# Patient Record
Sex: Male | Born: 1950 | Race: White | Hispanic: No | Smoking: Never smoker
Health system: Southern US, Community
[De-identification: ages and names within clinical notes are randomized; demographics above are authoritative.]

## PROBLEM LIST (undated history)

## (undated) DIAGNOSIS — I10 Essential (primary) hypertension: Secondary | ICD-10-CM

---

## 2004-05-08 HISTORY — PX: JOINT REPLACEMENT: SHX530

## 2005-06-12 ENCOUNTER — Ambulatory Visit: Payer: Self-pay | Admitting: Unknown Physician Specialty

## 2005-07-10 ENCOUNTER — Inpatient Hospital Stay (HOSPITAL_COMMUNITY): Admission: RE | Admit: 2005-07-10 | Discharge: 2005-07-12 | Payer: Self-pay | Admitting: Orthopedic Surgery

## 2009-07-09 ENCOUNTER — Ambulatory Visit: Payer: Self-pay | Admitting: Internal Medicine

## 2014-01-15 ENCOUNTER — Other Ambulatory Visit: Payer: Self-pay | Admitting: Orthopedic Surgery

## 2014-01-15 DIAGNOSIS — M25561 Pain in right knee: Secondary | ICD-10-CM

## 2014-01-24 ENCOUNTER — Ambulatory Visit
Admission: RE | Admit: 2014-01-24 | Discharge: 2014-01-24 | Disposition: A | Discharge status: Home / Self Care | Payer: BC Managed Care – PPO | Source: Ambulatory Visit | Attending: Orthopedic Surgery | Admitting: Orthopedic Surgery

## 2014-01-24 DIAGNOSIS — M25561 Pain in right knee: Secondary | ICD-10-CM

## 2014-02-25 ENCOUNTER — Ambulatory Visit: Payer: Self-pay | Admitting: General Practice

## 2014-08-29 NOTE — Op Note (Signed)
PATIENT NAME:  Joe Dalton, Joe Dalton MR#:  [id number] DATE OF BIRTH:  1951-04-24  DATE OF PROCEDURE:  02/25/2014  PREOPERATIVE DIAGNOSIS: Internal derangement of the right knee.   POSTOPERATIVE DIAGNOSES:  1.  Tear of the posterior horn of the medial meniscus, right knee.  2.  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   PROCEDURE PERFORMED: Right knee arthroscopy, partial medial meniscectomy, chondroplasty of the medial, lateral, and patellofemoral compartments.   SURGEON: Skip Estimable, MD.     ANESTHESIA: General.   ESTIMATED BLOOD LOSS: Minimal.   TOURNIQUET TIME: Not used.   FLUIDS REPLACED: 700 mL of crystalloid.   INDICATIONS FOR SURGERY: The patient is a 64 year old male who has been seen for complaints of stiffness and mild pain. MRI demonstrated findings consistent with meniscal pathology as well as some chondral thinning. After discussion of the risks and benefits of surgical intervention, the patient expressed understanding of the risks and benefits and agreed with plans for surgical intervention.   PROCEDURE IN DETAIL: The patient was brought to the operating room and after adequate general anesthesia was achieved, a tourniquet was placed and the patient's right thigh.  The leg was placed in a leg holder. All bony prominences were well padded. The patient's right knee and leg were cleaned and prepped with alcohol and DuraPrep and draped in the usual sterile fashion. A "timeout" was performed as per usual protocol. The anticipated portal sites were injected with 0.25% Marcaine with epinephrine. An anterolateral portal was created and a cannula was inserted. A mild effusion was evacuated. The scope was inserted and the knee was distended with fluid. The scope was advanced down the medial gutter and into the medial compartment of the knee. Under visualization with the scope, an anteromedial portal was created and hook probe was inserted.  Inspection of the medial compartment demonstrated a flap-type lesion involving the posterior horn of the medial meniscus. This had flipped up and over the meniscus proper and was impinging posteriorly. The fragment was pulled into the joint and then debrided using a combination of meniscal punches and a 4.5 mm shaver. Final contouring was performed using the 50 degree ArthroCare wand. The anterior horn was visualized and felt to be stable. There were changes of grade III chondromalacia involving primarily the medial femoral condyle. These areas were debrided and contoured using a combination of the 50 degree ArthroCare wand and the Paragon wand. Scope was then advanced into the intracondylar region. The anterior cruciate ligament was visualized and probed and felt to be stable. Some thickened synovial tissue was encountered in this area and this was debrided using the shaver. The scope was removed from the anterolateral portal and reinserted via the anteromedial portal so as to better visualize the lateral compartment. The articular surface was in reasonably good condition with the exception of localized defect involving the medial femoral condyle. The margins were debrided and contoured using the Paragon. The lateral meniscus was visualized and probed and felt to be stable. Finally, the scope was positioned so as to visualize the patellofemoral articulation. There were just some grade 2-3 changes involving the patellofemoral articulation. Good patellar tracking was noted. The area was debrided using the Paragon wand.   The knee was irrigated with copious amounts of fluid and then suctioned dry. The anterolateral portal was reapproximated using number 3-0 nylon. A combination of 0.25% Marcaine with epinephrine and 4 mg of morphine was injected via the scope. The scope was removed and the  anteromedial portal was reapproximated using number 3-0 nylon. Sterile dressing was applied followed by application of an ice  wrap. The patient tolerated the procedure well. He was transported to the recovery room in stable condition.     ____________________________ Laurice Record. Holley Bouche., MD jph:bu D: 02/25/2014 58:68:25 ET T: 02/26/2014 13:48:22 ET JOB#: [id number]  cc: Laurice Record. Holley Bouche., MD, <Dictator> Laurice Record Holley Bouche MD ELECTRONICALLY SIGNED 02/26/2014 19:53

## 2016-01-03 ENCOUNTER — Ambulatory Visit: Payer: Managed Care, Other (non HMO) | Admitting: Anesthesiology

## 2016-01-03 ENCOUNTER — Encounter: Payer: Self-pay | Admitting: *Deleted

## 2016-01-03 ENCOUNTER — Encounter
Admission: RE | Disposition: A | Discharge status: Home / Self Care | Payer: Self-pay | Source: Ambulatory Visit | Attending: Unknown Physician Specialty

## 2016-01-03 ENCOUNTER — Ambulatory Visit
Admission: RE | Admit: 2016-01-03 | Discharge: 2016-01-03 | Disposition: A | Discharge status: Home / Self Care | Payer: Managed Care, Other (non HMO) | Source: Ambulatory Visit | Attending: Unknown Physician Specialty | Admitting: Unknown Physician Specialty

## 2016-01-03 DIAGNOSIS — I10 Essential (primary) hypertension: Secondary | ICD-10-CM | POA: Diagnosis not present

## 2016-01-03 DIAGNOSIS — K640 First degree hemorrhoids: Secondary | ICD-10-CM | POA: Insufficient documentation

## 2016-01-03 DIAGNOSIS — D125 Benign neoplasm of sigmoid colon: Secondary | ICD-10-CM | POA: Insufficient documentation

## 2016-01-03 DIAGNOSIS — Z79899 Other long term (current) drug therapy: Secondary | ICD-10-CM | POA: Diagnosis not present

## 2016-01-03 DIAGNOSIS — Z7951 Long term (current) use of inhaled steroids: Secondary | ICD-10-CM | POA: Insufficient documentation

## 2016-01-03 DIAGNOSIS — Z1211 Encounter for screening for malignant neoplasm of colon: Secondary | ICD-10-CM | POA: Diagnosis not present

## 2016-01-03 HISTORY — DX: Essential (primary) hypertension: I10

## 2016-01-03 HISTORY — PX: COLONOSCOPY WITH PROPOFOL: SHX5780

## 2016-01-03 SURGERY — COLONOSCOPY WITH PROPOFOL
Anesthesia: General

## 2016-01-03 MED ORDER — SODIUM CHLORIDE 0.9 % IV SOLN
INTRAVENOUS | Status: DC
  Administered 2016-01-03: 1000 mL via INTRAVENOUS

## 2016-01-03 MED ORDER — PROPOFOL 500 MG/50ML IV EMUL
INTRAVENOUS | Status: DC | PRN
  Administered 2016-01-03: 125 ug/kg/min via INTRAVENOUS

## 2016-01-03 MED ORDER — SODIUM CHLORIDE 0.9 % IV SOLN: INTRAVENOUS | Status: DC

## 2016-01-03 MED ORDER — EPHEDRINE SULFATE 50 MG/ML IJ SOLN
INTRAMUSCULAR | Status: DC | PRN
  Administered 2016-01-03: 10 mg via INTRAVENOUS

## 2016-01-03 MED ORDER — PROPOFOL 10 MG/ML IV BOLUS
INTRAVENOUS | Status: DC | PRN
  Administered 2016-01-03: 120 mg via INTRAVENOUS

## 2016-01-03 MED ORDER — LIDOCAINE HCL (CARDIAC) 20 MG/ML IV SOLN
INTRAVENOUS | Status: DC | PRN
  Administered 2016-01-03: 30 mg via INTRAVENOUS

## 2016-01-03 NOTE — Anesthesia Preprocedure Evaluation (Signed)
Anesthesia Evaluation  Patient identified by MRN, date of birth, ID band Patient awake    Reviewed: Allergy & Precautions, H&P , NPO status , Patient's Chart, lab work & pertinent test results, reviewed documented beta blocker date and time   History of Anesthesia Complications Negative for: history of anesthetic complications  Airway Mallampati: I  TM Distance: >3 FB Neck ROM: full    Dental no notable dental hx. (+) Caps, Missing   Pulmonary neg pulmonary ROS,    Pulmonary exam normal breath sounds clear to auscultation       Cardiovascular Exercise Tolerance: Good hypertension, On Medications Normal cardiovascular exam Rhythm:regular Rate:Normal     Neuro/Psych negative neurological ROS  negative psych ROS   GI/Hepatic negative GI ROS, Neg liver ROS,   Endo/Other  negative endocrine ROS  Renal/GU negative Renal ROS  negative genitourinary   Musculoskeletal   Abdominal   Peds  Hematology negative hematology ROS (+)   Anesthesia Other Findings Past Medical History: No date: Hypertension   Reproductive/Obstetrics negative OB ROS                             Anesthesia Physical Anesthesia Plan  ASA: II  Anesthesia Plan: General   Post-op Pain Management:    Induction:   Airway Management Planned:   Additional Equipment:   Intra-op Plan:   Post-operative Plan:   Informed Consent: I have reviewed the patients History and Physical, chart, labs and discussed the procedure including the risks, benefits and alternatives for the proposed anesthesia with the patient or authorized representative who has indicated his/her understanding and acceptance.   Dental Advisory Given  Plan Discussed with: Anesthesiologist, CRNA and Surgeon  Anesthesia Plan Comments:         Anesthesia Quick Evaluation

## 2016-01-03 NOTE — Op Note (Signed)
[facility] Patient Name: Joe Dalton Procedure Date: 01/03/2016 7:39 AM MRN: [id number] Account #: [id number] Date of Birth: 01/23/1951 Admit Type: Outpatient Age: 65 Room: [facility] ENDO ROOM 1 Gender: Male Note Status: Finalized Procedure:            Colonoscopy Indications:          Screening for colorectal malignant neoplasm Providers:            Manya Silvas, MD Referring MD:         Leonie Douglas. Doy Hutching, MD (Referring MD) Medicines:            Propofol per Anesthesia Complications:        No immediate complications. Procedure:            Pre-Anesthesia Assessment:                       - After reviewing the risks and benefits, the patient                        was deemed in satisfactory condition to undergo the                        procedure.                       After obtaining informed consent, the colonoscope was                        passed under direct vision. Throughout the procedure,                        the patient's blood pressure, pulse, and oxygen                        saturations were monitored continuously. The                        Colonoscope was introduced through the anus and                        advanced to the the cecum, identified by appendiceal                        orifice and ileocecal valve. The colonoscopy was                        performed without difficulty. The patient tolerated the                        procedure well. The quality of the bowel preparation                        was excellent. Findings:      Two sessile polyps were found in the sigmoid colon. The polyps were       diminutive in size. These polyps were removed with a jumbo cold forceps.       Resection and retrieval were complete.      Internal hemorrhoids were found during endoscopy. The hemorrhoids were       small and Grade I (internal hemorrhoids that do not prolapse).  The exam was otherwise without abnormality. Impression:            - Two diminutive polyps in the sigmoid colon, removed                        with a jumbo cold forceps. Resected and retrieved.                       - Internal hemorrhoids.                       - The examination was otherwise normal. Recommendation:       - Await pathology results. Manya Silvas, MD 01/03/2016 8:02:48 AM This report has been signed electronically. Number of Addenda: 0 Note Initiated On: 01/03/2016 7:39 AM Scope Withdrawal Time: 0 hours 13 minutes 47 seconds  Total Procedure Duration: 0 hours 16 minutes 12 seconds       [facility]

## 2016-01-03 NOTE — Transfer of Care (Signed)
Immediate Anesthesia Transfer of Care Note  Patient: Joe Dalton  Procedure(s) Performed: Procedure(s): COLONOSCOPY WITH PROPOFOL (N/A)  Patient Location: Endoscopy Unit  Anesthesia Type:General  Level of Consciousness: sedated  Airway & Oxygen Therapy: Patient Spontanous Breathing and Patient connected to nasal cannula oxygen  Post-op Assessment: Report given to RN and Post -op Vital signs reviewed and stable  Post vital signs: Reviewed and stable  Last Vitals:  Vitals:   01/03/16 0657  BP: 132/79  Pulse: 78  Resp: 18  Temp: (!) 35.6 C    Last Pain:  Vitals:   01/03/16 0657  TempSrc: Tympanic         Complications: No apparent anesthesia complications

## 2016-01-03 NOTE — Anesthesia Postprocedure Evaluation (Signed)
Anesthesia Post Note  Patient: Joe Dalton  Procedure(s) Performed: Procedure(s) (LRB): COLONOSCOPY WITH PROPOFOL (N/A)  Patient location during evaluation: Endoscopy Anesthesia Type: General Level of consciousness: awake and alert Pain management: pain level controlled Vital Signs Assessment: post-procedure vital signs reviewed and stable Respiratory status: spontaneous breathing, nonlabored ventilation, respiratory function stable and patient connected to nasal cannula oxygen Cardiovascular status: blood pressure returned to baseline and stable Postop Assessment: no signs of nausea or vomiting Anesthetic complications: no    Last Vitals:  Vitals:   01/03/16 0820 01/03/16 0830  BP: 95/70 106/66  Pulse: 75 75  Resp: 18 18  Temp:      Last Pain:  Vitals:   01/03/16 0800  TempSrc: Tympanic                 Martha Clan

## 2016-01-03 NOTE — H&P (Signed)
   Primary Care Physician:  Idelle Crouch, MD Primary Gastroenterologist:  Dr. Vira Agar  Pre-Procedure History & Physical: HPI:  Joe Dalton is a 65 y.o. male is here for an colonoscopy.   Past Medical History:  Diagnosis Date  . Hypertension     History reviewed. No pertinent surgical history.  Prior to Admission medications   Medication Sig Start Date End Date Taking? Authorizing Provider  telmisartan-hydrochlorothiazide (MICARDIS HCT) 80-25 MG tablet Take 1 tablet by mouth daily.   Yes Historical Provider, MD  chlorpheniramine-HYDROcodone (TUSSIONEX PENNKINETIC ER) 10-8 MG/5ML SUER Take 5 mLs by mouth.    Historical Provider, MD  fluticasone (FLONASE) 50 MCG/ACT nasal spray Place into both nostrils daily.    Historical Provider, MD    Allergies as of 11/22/2015  . (Not on File)    History reviewed. No pertinent family history.  Social History   Social History  . Marital status: Divorced    Spouse name: N/A  . Number of children: N/A  . Years of education: N/A   Occupational History  . Not on file.   Social History Main Topics  . Smoking status: Not on file  . Smokeless tobacco: Not on file  . Alcohol use Not on file  . Drug use: Unknown  . Sexual activity: Not on file   Other Topics Concern  . Not on file   Social History Narrative  . No narrative on file    Review of Systems: See HPI, otherwise negative ROS  Physical Exam: BP 132/79   Pulse 78   Temp (!) 96.1 F (35.6 C) (Tympanic)   Resp 18   Ht 6' (1.829 m)   Wt 122.5 kg (270 lb)   SpO2 98%   BMI 36.62 kg/m  General:   Alert,  pleasant and cooperative in NAD Head:  Normocephalic and atraumatic. Neck:  Supple; no masses or thyromegaly. Lungs:  Clear throughout to auscultation.    Heart:  Regular rate and rhythm. Abdomen:  Soft, nontender and nondistended. Normal bowel sounds, without guarding, and without rebound.   Neurologic:  Alert and  oriented x4;  grossly normal  neurologically.  Impression/Plan: Joe Dalton is here for an colonoscopy to be performed for screening  Risks, benefits, limitations, and alternatives regarding  colonoscopy have been reviewed with the patient.  Questions have been answered.  All parties agreeable.   Gaylyn Cheers, MD  01/03/2016, 7:32 AM

## 2016-01-04 LAB — SURGICAL PATHOLOGY

## 2016-02-17 ENCOUNTER — Encounter: Payer: Self-pay | Admitting: Unknown Physician Specialty

## 2019-12-24 ENCOUNTER — Other Ambulatory Visit: Payer: Self-pay | Admitting: Surgery

## 2019-12-31 ENCOUNTER — Other Ambulatory Visit: Payer: Self-pay

## 2019-12-31 ENCOUNTER — Encounter
Admission: RE | Admit: 2019-12-31 | Discharge: 2019-12-31 | Disposition: A | Discharge status: Home / Self Care | Payer: Medicare Other | Source: Ambulatory Visit | Attending: Surgery | Admitting: Surgery

## 2019-12-31 NOTE — Patient Instructions (Signed)
Your procedure is scheduled on: Wednesday January 07, 2020. Report to Day Surgery. To find out your arrival time please call [phone] between 1PM - 3PM on Tuesday January 05, 2020.  Remember: Instructions that are not followed completely may result in serious medical risk,  up to and including death, or upon the discretion of your surgeon and anesthesiologist your  surgery may need to be rescheduled.     _X__ 1. Do not eat food after midnight the night before your procedure.                 No chewing gum or hard candies. You may drink clear liquids up to 2 hours                 before you are scheduled to arrive for your surgery- DO not drink clear                 liquids within 2 hours of the start of your surgery.                 Clear Liquids include:  water, apple juice without pulp, clear Gatorade, G2 or                  Gatorade Zero (avoid Red/Purple/Blue), Black Coffee or Tea (Do not add                 anything to coffee or tea).  __X__2.   Complete the "Ensure Clear Pre-surgery Clear Carbohydrate Drink" provided to you, 2 hours before arrival. **If you are diabetic you will be provided with an alternative drink, Gatorade Zero or G2.  __X__3.  On the morning of surgery brush your teeth with toothpaste and water, you                may rinse your mouth with mouthwash if you wish.  Do not swallow any toothpaste of mouthwash.     _X__ 4.  No Alcohol for 24 hours before or after surgery.   _X__ 5.  Do Not Smoke or use e-cigarettes For 24 Hours Prior to Your Surgery.                 Do not use any chewable tobacco products for at least 6 hours prior to                 Surgery.  _X__  6.  Do not use any recreational drugs (marijuana, cocaine, heroin, ecstasy, MDMA or other)                For at least one week prior to your surgery.  Combination of these drugs with anesthesia                May have life threatening results.  __X__  7.  Notify your  doctor if there is any change in your medical condition      (cold, fever, infections).     Do not wear jewelry, make-up, hairpins, clips or nail polish. Do not wear lotions, powders, or perfumes. You may wear deodorant. Do not shave 48 hours prior to surgery. Men may shave face and neck. Do not bring valuables to the hospital.    [facility] is not responsible for any belongings or valuables.  Contacts, dentures or bridgework may not be worn into surgery. Leave your suitcase in the car. After surgery it may be brought to your room. For patients admitted to the hospital,  discharge time is determined by your treatment team.   Patients discharged the day of surgery will not be allowed to drive home.   Make arrangements for someone to be with you for the first 24 hours of your Same Day Discharge.   __X__ Take these medicines the morning of surgery with A SIP OF WATER:    1. None   __X__ Use CHG Soap (or wipes) as directed  __X__ Stop Anti-inflammatories such as Ibuprofen, Aleve, Advil, naproxen, aspirin and or BC powders.    __X__ Stop supplements until after surgery.    __X__ Do not start any herbal supplements before your surgery.    If you have any questions regarding your pre-procedure instructions,  Please call Pre-admit Testing at [phone].

## 2020-01-05 ENCOUNTER — Other Ambulatory Visit
Admission: RE | Admit: 2020-01-05 | Discharge: 2020-01-05 | Disposition: A | Discharge status: Home / Self Care | Payer: Medicare Other | Source: Ambulatory Visit | Attending: Surgery | Admitting: Surgery

## 2020-01-05 ENCOUNTER — Encounter: Payer: Self-pay | Admitting: Urgent Care

## 2020-01-05 ENCOUNTER — Other Ambulatory Visit: Payer: Self-pay

## 2020-01-05 DIAGNOSIS — M19011 Primary osteoarthritis, right shoulder: Secondary | ICD-10-CM | POA: Insufficient documentation

## 2020-01-05 DIAGNOSIS — Z20822 Contact with and (suspected) exposure to covid-19: Secondary | ICD-10-CM | POA: Insufficient documentation

## 2020-01-05 DIAGNOSIS — Z01812 Encounter for preprocedural laboratory examination: Secondary | ICD-10-CM | POA: Insufficient documentation

## 2020-01-05 LAB — SARS CORONAVIRUS 2 (TAT 6-24 HRS): SARS Coronavirus 2: NEGATIVE

## 2020-01-07 ENCOUNTER — Ambulatory Visit: Payer: Medicare Other | Admitting: Certified Registered"

## 2020-01-07 ENCOUNTER — Encounter: Payer: Self-pay | Admitting: Surgery

## 2020-01-07 ENCOUNTER — Ambulatory Visit
Admission: RE | Admit: 2020-01-07 | Discharge: 2020-01-07 | Disposition: A | Discharge status: Home / Self Care | Payer: Medicare Other | Attending: Surgery | Admitting: Surgery

## 2020-01-07 ENCOUNTER — Encounter
Admission: RE | Disposition: A | Discharge status: Home / Self Care | Payer: Self-pay | Source: Home / Self Care | Attending: Surgery

## 2020-01-07 ENCOUNTER — Ambulatory Visit: Payer: Medicare Other

## 2020-01-07 ENCOUNTER — Other Ambulatory Visit: Payer: Self-pay

## 2020-01-07 ENCOUNTER — Ambulatory Visit: Payer: Medicare Other | Admitting: Urgent Care

## 2020-01-07 DIAGNOSIS — Z96652 Presence of left artificial knee joint: Secondary | ICD-10-CM | POA: Diagnosis not present

## 2020-01-07 DIAGNOSIS — Z79899 Other long term (current) drug therapy: Secondary | ICD-10-CM | POA: Insufficient documentation

## 2020-01-07 DIAGNOSIS — N289 Disorder of kidney and ureter, unspecified: Secondary | ICD-10-CM | POA: Insufficient documentation

## 2020-01-07 DIAGNOSIS — I10 Essential (primary) hypertension: Secondary | ICD-10-CM | POA: Insufficient documentation

## 2020-01-07 DIAGNOSIS — M19011 Primary osteoarthritis, right shoulder: Secondary | ICD-10-CM | POA: Diagnosis not present

## 2020-01-07 DIAGNOSIS — Z419 Encounter for procedure for purposes other than remedying health state, unspecified: Secondary | ICD-10-CM

## 2020-01-07 HISTORY — PX: RESECTION DISTAL CLAVICAL: SHX5053

## 2020-01-07 SURGERY — EXCISION, CLAVICLE, DISTAL, OPEN
Anesthesia: General | Laterality: Right

## 2020-01-07 MED ORDER — BUPIVACAINE-EPINEPHRINE (PF) 0.25% -1:200000 IJ SOLN
INTRAMUSCULAR | Status: AC
  Filled 2020-01-07: qty 30

## 2020-01-07 MED ORDER — POTASSIUM CHLORIDE IN NACL 20-0.9 MEQ/L-% IV SOLN
INTRAVENOUS | Status: DC
  Filled 2020-01-07 (×3): qty 1000

## 2020-01-07 MED ORDER — ONDANSETRON HCL 4 MG PO TABS: 4.0000 mg | ORAL_TABLET | Freq: Four times a day (QID) | ORAL | Status: DC | PRN

## 2020-01-07 MED ORDER — ONDANSETRON HCL 4 MG/2ML IJ SOLN
4.0000 mg | Freq: Four times a day (QID) | INTRAMUSCULAR | Status: DC | PRN
  Administered 2020-01-07: 4 mg via INTRAVENOUS

## 2020-01-07 MED ORDER — ROCURONIUM BROMIDE 10 MG/ML (PF) SYRINGE
PREFILLED_SYRINGE | INTRAVENOUS | Status: AC
  Filled 2020-01-07: qty 10

## 2020-01-07 MED ORDER — FENTANYL CITRATE (PF) 100 MCG/2ML IJ SOLN
INTRAMUSCULAR | Status: AC
  Filled 2020-01-07: qty 2

## 2020-01-07 MED ORDER — LACTATED RINGERS IV SOLN: INTRAVENOUS | Status: DC

## 2020-01-07 MED ORDER — LIDOCAINE HCL (CARDIAC) PF 100 MG/5ML IV SOSY
PREFILLED_SYRINGE | INTRAVENOUS | Status: DC | PRN
  Administered 2020-01-07: 100 mg via INTRAVENOUS

## 2020-01-07 MED ORDER — METOCLOPRAMIDE HCL 10 MG PO TABS: 5.0000 mg | ORAL_TABLET | Freq: Three times a day (TID) | ORAL | Status: DC | PRN

## 2020-01-07 MED ORDER — BUPIVACAINE LIPOSOME 1.3 % IJ SUSP
INTRAMUSCULAR | Status: AC
  Filled 2020-01-07: qty 20

## 2020-01-07 MED ORDER — ORAL CARE MOUTH RINSE: 15.0000 mL | Freq: Once | OROMUCOSAL | Status: AC

## 2020-01-07 MED ORDER — CHLORHEXIDINE GLUCONATE 0.12 % MT SOLN: 15.0000 mL | Freq: Once | OROMUCOSAL | Status: DC

## 2020-01-07 MED ORDER — ACETAMINOPHEN 10 MG/ML IV SOLN
INTRAVENOUS | Status: AC
  Filled 2020-01-07: qty 100

## 2020-01-07 MED ORDER — FAMOTIDINE 20 MG PO TABS
ORAL_TABLET | ORAL | Status: AC
  Administered 2020-01-07: 20 mg via ORAL
  Filled 2020-01-07: qty 1

## 2020-01-07 MED ORDER — FENTANYL CITRATE (PF) 100 MCG/2ML IJ SOLN
INTRAMUSCULAR | Status: AC
Start: 2020-01-07 — End: ?
  Filled 2020-01-07: qty 2

## 2020-01-07 MED ORDER — ACETAMINOPHEN 10 MG/ML IV SOLN
INTRAVENOUS | Status: DC | PRN
  Administered 2020-01-07: 1000 mg via INTRAVENOUS

## 2020-01-07 MED ORDER — DEXAMETHASONE SODIUM PHOSPHATE 10 MG/ML IJ SOLN
INTRAMUSCULAR | Status: DC | PRN
  Administered 2020-01-07: 5 mg via INTRAVENOUS

## 2020-01-07 MED ORDER — FAMOTIDINE 20 MG PO TABS: 20.0000 mg | ORAL_TABLET | Freq: Once | ORAL | Status: AC

## 2020-01-07 MED ORDER — DEXAMETHASONE SODIUM PHOSPHATE 10 MG/ML IJ SOLN
INTRAMUSCULAR | Status: AC
  Filled 2020-01-07: qty 1

## 2020-01-07 MED ORDER — DEXMEDETOMIDINE HCL 200 MCG/2ML IV SOLN
INTRAVENOUS | Status: DC | PRN
  Administered 2020-01-07: 12 ug via INTRAVENOUS

## 2020-01-07 MED ORDER — PROPOFOL 10 MG/ML IV BOLUS
INTRAVENOUS | Status: DC | PRN
  Administered 2020-01-07: 180 mg via INTRAVENOUS

## 2020-01-07 MED ORDER — METOCLOPRAMIDE HCL 5 MG/ML IJ SOLN: 5.0000 mg | Freq: Three times a day (TID) | INTRAMUSCULAR | Status: DC | PRN

## 2020-01-07 MED ORDER — HYDROCODONE-ACETAMINOPHEN 5-325 MG PO TABS
1.0000 | ORAL_TABLET | Freq: Four times a day (QID) | ORAL | 0 refills | Status: AC | PRN
Start: 2020-01-07 — End: ?

## 2020-01-07 MED ORDER — ONDANSETRON HCL 4 MG/2ML IJ SOLN
INTRAMUSCULAR | Status: AC
  Filled 2020-01-07: qty 2

## 2020-01-07 MED ORDER — PROMETHAZINE HCL 25 MG/ML IJ SOLN: 6.2500 mg | INTRAMUSCULAR | Status: DC | PRN

## 2020-01-07 MED ORDER — FENTANYL CITRATE (PF) 100 MCG/2ML IJ SOLN
INTRAMUSCULAR | Status: DC | PRN
Start: 2020-01-07 — End: 2020-01-07
  Administered 2020-01-07: 100 ug via INTRAVENOUS
  Administered 2020-01-07 (×2): 50 ug via INTRAVENOUS

## 2020-01-07 MED ORDER — BUPIVACAINE LIPOSOME 1.3 % IJ SUSP
INTRAMUSCULAR | Status: DC | PRN
  Administered 2020-01-07: 20 mL

## 2020-01-07 MED ORDER — PROPOFOL 10 MG/ML IV BOLUS
INTRAVENOUS | Status: AC
  Filled 2020-01-07: qty 40

## 2020-01-07 MED ORDER — LIDOCAINE HCL (PF) 2 % IJ SOLN
INTRAMUSCULAR | Status: AC
  Filled 2020-01-07: qty 5

## 2020-01-07 MED ORDER — PHENYLEPHRINE HCL-NACL 20-0.9 MG/250ML-% IV SOLN
INTRAVENOUS | Status: DC | PRN
  Administered 2020-01-07: 50 ug/min via INTRAVENOUS

## 2020-01-07 MED ORDER — EPHEDRINE SULFATE 50 MG/ML IJ SOLN
INTRAMUSCULAR | Status: DC | PRN
  Administered 2020-01-07: 10 mg via INTRAVENOUS

## 2020-01-07 MED ORDER — CHLORHEXIDINE GLUCONATE 0.12 % MT SOLN: 15.0000 mL | Freq: Once | OROMUCOSAL | Status: AC

## 2020-01-07 MED ORDER — DEXTROSE 5 % IV SOLN
3.0000 g | INTRAVENOUS | Status: AC
  Administered 2020-01-07: 3 g via INTRAVENOUS
  Filled 2020-01-07: qty 3

## 2020-01-07 MED ORDER — OXYCODONE HCL 5 MG/5ML PO SOLN: 5.0000 mg | Freq: Once | ORAL | Status: DC | PRN

## 2020-01-07 MED ORDER — OXYCODONE HCL 5 MG PO TABS: 5.0000 mg | ORAL_TABLET | Freq: Once | ORAL | Status: DC | PRN

## 2020-01-07 MED ORDER — MEPERIDINE HCL 50 MG/ML IJ SOLN: 6.2500 mg | INTRAMUSCULAR | Status: DC | PRN

## 2020-01-07 MED ORDER — BUPIVACAINE-EPINEPHRINE (PF) 0.5% -1:200000 IJ SOLN
INTRAMUSCULAR | Status: AC
  Filled 2020-01-07: qty 30

## 2020-01-07 MED ORDER — FENTANYL CITRATE (PF) 100 MCG/2ML IJ SOLN: 25.0000 ug | INTRAMUSCULAR | Status: DC | PRN

## 2020-01-07 MED ORDER — ONDANSETRON HCL 4 MG/2ML IJ SOLN
INTRAMUSCULAR | Status: DC | PRN
  Administered 2020-01-07: 4 mg via INTRAVENOUS

## 2020-01-07 MED ORDER — ROCURONIUM BROMIDE 100 MG/10ML IV SOLN
INTRAVENOUS | Status: DC | PRN
  Administered 2020-01-07: 60 mg via INTRAVENOUS

## 2020-01-07 MED ORDER — HYDROCODONE-ACETAMINOPHEN 5-325 MG PO TABS: 1.0000 | ORAL_TABLET | ORAL | Status: DC | PRN

## 2020-01-07 MED ORDER — ORAL CARE MOUTH RINSE: 15.0000 mL | Freq: Once | OROMUCOSAL | Status: DC

## 2020-01-07 MED ORDER — PHENYLEPHRINE HCL (PRESSORS) 10 MG/ML IV SOLN
INTRAVENOUS | Status: DC | PRN
  Administered 2020-01-07 (×3): 100 ug via INTRAVENOUS

## 2020-01-07 MED ORDER — EPHEDRINE 5 MG/ML INJ
INTRAVENOUS | Status: AC
  Filled 2020-01-07: qty 10

## 2020-01-07 MED ORDER — CHLORHEXIDINE GLUCONATE 0.12 % MT SOLN
OROMUCOSAL | Status: AC
  Administered 2020-01-07: 15 mL via OROMUCOSAL
  Filled 2020-01-07: qty 15

## 2020-01-07 MED ORDER — SUGAMMADEX SODIUM 200 MG/2ML IV SOLN
INTRAVENOUS | Status: DC | PRN
  Administered 2020-01-07: 200 mg via INTRAVENOUS

## 2020-01-07 MED ORDER — PHENYLEPHRINE HCL (PRESSORS) 10 MG/ML IV SOLN
INTRAVENOUS | Status: AC
  Filled 2020-01-07: qty 1

## 2020-01-07 SURGICAL SUPPLY — 45 items
APL PRP STRL LF DISP 70% ISPRP (MISCELLANEOUS) ×2
BLADE MED AGGRESSIVE (BLADE) ×2 IMPLANT
BNDG COHESIVE 4X5 TAN STRL (GAUZE/BANDAGES/DRESSINGS) ×3 IMPLANT
CANISTER SUCT 1200ML W/VALVE (MISCELLANEOUS) ×3 IMPLANT
CHLORAPREP W/TINT 26 (MISCELLANEOUS) ×6 IMPLANT
CLOSURE WOUND 1/2 X4 (GAUZE/BANDAGES/DRESSINGS) ×1
CLOSURE WOUND 1/4X4 (GAUZE/BANDAGES/DRESSINGS) ×1
COVER WAND RF STERILE (DRAPES) ×3 IMPLANT
DRAPE C-ARM XRAY 36X54 (DRAPES) ×1 IMPLANT
DRAPE INCISE IOBAN 66X45 STRL (DRAPES) ×6 IMPLANT
DRAPE SPLIT 6X30 W/TAPE (DRAPES) ×6 IMPLANT
DRSG OPSITE POSTOP 4X6 (GAUZE/BANDAGES/DRESSINGS) ×3 IMPLANT
GAUZE SPONGE 4X4 12PLY STRL (GAUZE/BANDAGES/DRESSINGS) ×3 IMPLANT
GAUZE XEROFORM 1X8 LF (GAUZE/BANDAGES/DRESSINGS) ×2 IMPLANT
GLOVE BIO SURGEON STRL SZ7.5 (GLOVE) ×6 IMPLANT
GLOVE BIO SURGEON STRL SZ8 (GLOVE) ×6 IMPLANT
GLOVE BIOGEL PI IND STRL 8 (GLOVE) ×2 IMPLANT
GLOVE BIOGEL PI INDICATOR 8 (GLOVE) ×4
GLOVE INDICATOR 8.0 STRL GRN (GLOVE) ×3 IMPLANT
GLOVE SURG XRAY 8.0 LX (GLOVE) ×1 IMPLANT
GOWN STRL REUS W/ TWL LRG LVL3 (GOWN DISPOSABLE) ×1 IMPLANT
GOWN STRL REUS W/ TWL XL LVL3 (GOWN DISPOSABLE) ×1 IMPLANT
GOWN STRL REUS W/TWL LRG LVL3 (GOWN DISPOSABLE) ×3
GOWN STRL REUS W/TWL XL LVL3 (GOWN DISPOSABLE) ×3
IMMOBILIZER SHDR LG LX 900803 (SOFTGOODS) ×3 IMPLANT
KIT STABILIZATION SHOULDER (MISCELLANEOUS) ×1 IMPLANT
KIT TURNOVER KIT A (KITS) ×3 IMPLANT
MASK FACE SPIDER DISP (MASK) ×3 IMPLANT
MAT ABSORB  FLUID 56X50 GRAY (MISCELLANEOUS) ×3
MAT ABSORB FLUID 56X50 GRAY (MISCELLANEOUS) ×1 IMPLANT
NDL FILTER BLUNT 18X1 1/2 (NEEDLE) ×1 IMPLANT
NEEDLE FILTER BLUNT 18X 1/2SAF (NEEDLE) ×2
NEEDLE FILTER BLUNT 18X1 1/2 (NEEDLE) ×1 IMPLANT
NS IRRIG 500ML POUR BTL (IV SOLUTION) ×3 IMPLANT
PACK SHDR ARTHRO (MISCELLANEOUS) ×3 IMPLANT
SLING ARM LRG DEEP (SOFTGOODS) ×2 IMPLANT
STAPLER SKIN PROX 35W (STAPLE) ×3 IMPLANT
STRIP CLOSURE SKIN 1/2X4 (GAUZE/BANDAGES/DRESSINGS) ×2 IMPLANT
STRIP CLOSURE SKIN 1/4X4 (GAUZE/BANDAGES/DRESSINGS) ×1 IMPLANT
SUT VIC AB 0 CT1 36 (SUTURE) ×2 IMPLANT
SUT VIC AB 2-0 CT1 27 (SUTURE) ×3
SUT VIC AB 2-0 CT1 TAPERPNT 27 (SUTURE) ×2 IMPLANT
SUT VIC AB 2-0 CT2 27 (SUTURE) ×4 IMPLANT
SYR 10ML LL (SYRINGE) ×3 IMPLANT
SYR BULB IRRIG 60ML STRL (SYRINGE) ×2 IMPLANT

## 2020-01-07 NOTE — Anesthesia Postprocedure Evaluation (Signed)
Anesthesia Post Note  Patient: Joe Dalton  Procedure(s) Performed: DISTAL CLAVICAL EXCISON OPEN (Right )  Patient location during evaluation: PACU Anesthesia Type: General Level of consciousness: awake and alert Pain management: pain level controlled Vital Signs Assessment: post-procedure vital signs reviewed and stable Respiratory status: spontaneous breathing and respiratory function stable Cardiovascular status: stable Anesthetic complications: no   No complications documented.   Last Vitals:  Vitals:   01/07/20 1015 01/07/20 1022  BP: 135/68 137/69  Pulse: 70 75  Resp: 18 18  Temp: (!) 36.1 C (!) 36.3 C  SpO2: 95% 95%    Last Pain:  Vitals:   01/07/20 1022  TempSrc: Temporal  PainSc: 0-No pain                 Karlissa Aron K

## 2020-01-07 NOTE — Discharge Instructions (Addendum)
AMBULATORY SURGERY  DISCHARGE INSTRUCTIONS   1) The drugs that you were given will stay in your system until tomorrow so for the next 24 hours you should not:  A) Drive an automobile B) Make any legal decisions C) Drink any alcoholic beverage   2) You may resume regular meals tomorrow.  Today it is better to start with liquids and gradually work up to solid foods.  You may eat anything you prefer, but it is better to start with liquids, then soup and crackers, and gradually work up to solid foods.   3) Please notify your doctor immediately if you have any unusual bleeding, trouble breathing, redness and pain at the surgery site, drainage, fever, or pain not relieved by medication.    4) Additional Instructions:        Please contact your physician with any problems or Same Day Surgery at [phone], Monday through Friday 6 am to 4 pm, or  at [facility] number at [phone].Orthopedic discharge instructions: May shower with intact op-site dressing. Apply ice frequently to shoulder. Take Aleve 2 tabs BID with meals for 7-10 days, then as necessary. Take hydrocodone as prescribed when needed.  May supplement with ES Tylenol if necessary. Use sling as necessary for comfort. Follow-up in 10-14 days or as scheduled.

## 2020-01-07 NOTE — Anesthesia Preprocedure Evaluation (Addendum)
Anesthesia Evaluation  Patient identified by MRN, date of birth, ID band Patient awake    Reviewed: Allergy & Precautions, NPO status , Patient's Chart, lab work & pertinent test results  History of Anesthesia Complications Negative for: history of anesthetic complications  Airway Mallampati: II  TM Distance: >3 FB Neck ROM: Full    Dental  (+) Implants   Pulmonary neg pulmonary ROS, neg sleep apnea, neg COPD,    breath sounds clear to auscultation- rhonchi (-) wheezing      Cardiovascular Exercise Tolerance: Good hypertension, Pt. on medications (-) CAD, (-) Past MI, (-) Cardiac Stents and (-) CABG  Rhythm:Regular Rate:Normal - Systolic murmurs and - Diastolic murmurs    Neuro/Psych neg Seizures negative neurological ROS  negative psych ROS   GI/Hepatic negative GI ROS, Neg liver ROS,   Endo/Other  negative endocrine ROSneg diabetes  Renal/GU negative Renal ROS     Musculoskeletal negative musculoskeletal ROS (+)   Abdominal (+) + obese,   Peds  Hematology negative hematology ROS (+)   Anesthesia Other Findings Past Medical History: No date: Hypertension   Reproductive/Obstetrics                             Anesthesia Physical Anesthesia Plan  ASA: II  Anesthesia Plan: General   Post-op Pain Management:    Induction: Intravenous  PONV Risk Score and Plan: 1 and Ondansetron and Dexamethasone  Airway Management Planned: Oral ETT  Additional Equipment:   Intra-op Plan:   Post-operative Plan: Extubation in OR  Informed Consent: I have reviewed the patients History and Physical, chart, labs and discussed the procedure including the risks, benefits and alternatives for the proposed anesthesia with the patient or authorized representative who has indicated his/her understanding and acceptance.     Dental advisory given  Plan Discussed with: CRNA and  Anesthesiologist  Anesthesia Plan Comments:         Anesthesia Quick Evaluation

## 2020-01-07 NOTE — Anesthesia Procedure Notes (Signed)
Procedure Name: Intubation Date/Time: 01/07/2020 8:25 AM Performed by: Chanetta Marshall, CRNA Pre-anesthesia Checklist: Patient identified, Emergency Drugs available, Suction available and Patient being monitored Patient Re-evaluated:Patient Re-evaluated prior to induction Oxygen Delivery Method: Circle system utilized Preoxygenation: Pre-oxygenation with 100% oxygen Induction Type: IV induction Ventilation: Mask ventilation with difficulty and Two handed mask ventilation required Laryngoscope Size: McGraph and 3 Grade View: Grade I Tube type: Oral Tube size: 7.5 mm Number of attempts: 1 Airway Equipment and Method: Video-laryngoscopy Placement Confirmation: ETT inserted through vocal cords under direct vision,  positive ETCO2,  breath sounds checked- equal and bilateral and CO2 detector Secured at: 23 cm Tube secured with: Tape Dental Injury: Teeth and Oropharynx as per pre-operative assessment

## 2020-01-07 NOTE — Transfer of Care (Signed)
Immediate Anesthesia Transfer of Care Note  Patient: Joe Dalton  Procedure(s) Performed: DISTAL CLAVICAL EXCISON OPEN (Right )  Patient Location: PACU  Anesthesia Type:General  Level of Consciousness: awake, alert  and oriented  Airway & Oxygen Therapy: Patient Spontanous Breathing and Patient connected to face mask oxygen  Post-op Assessment: Report given to RN and Post -op Vital signs reviewed and stable  Post vital signs: Reviewed and stable  Last Vitals:  Vitals Value Taken Time  BP    Temp    Pulse    Resp    SpO2      Last Pain:  Vitals:   01/07/20 0709  TempSrc: Temporal  PainSc: 0-No pain      Patients Stated Pain Goal: 0 (57/47/34 0370)  Complications: No complications documented.

## 2020-01-07 NOTE — H&P (Signed)
History of Present Illness:  Joe Dalton is a 69 y.o. male who presents today as a result of a call to our clinic for right shoulder pain.   The patient's symptoms began about 6 months ago and developed without any specific cause or injury. 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 Also, the patient played football through college. He saw Dr. Candelaria Stagers 3 months ago who obtained x-rays of his shoulder, diagnosed him with degenerative joint disease, and gave him an intra-articular steroid injection under ultrasound guidance which he states provided little if any relief of his symptoms. The patient describes the symptoms as moderate (patient is active but has had to make modifications or give up activities) and have the quality of being aching, nagging, stabbing and throbbing. The pain is localized to the acromioclavicular joint. These symptoms are aggravated with normal daily activities, with sleeping, at higher levels of activity, with overhead activity and exercising. He has tried no medications for this problem. He has tried rest with no significant benefit. He has tried the 1 injection described above, but otherwise has not tried any other treatments for these symptoms.  This complaint is not work related. He is a sports non-participant.  Shoulder Surgical History:  The patient has had no shoulder surgery in the past.  PMH/PSH/Family History/Social History/Meds/Allergies:  I have reviewed past medical, surgical, social and family history, medications and allergies as documented in the EMR.  Current Outpatient Medications: . telmisartan-hydrochlorothiazide (MICARDIS HCT) 80-25 mg tablet Take 1 tablet by mouth once daily 90 tablet 3   No current Epic-ordered facility-administered medications on file.   Allergies: No Known Allergies  Past Medical History:  . Anemia  remote hx of  . Gastric ulcer  . Hx of viral meningitis  . Hypertension  . Renal  insufficiency  . Venous stasis  with peripheral edema   Past Surgical History:  . COLONOSCOPY 06/12/2005  Int Hemorrhoids: CBF 06/2015; Recall Ltr mailed 04/27/2015 (dw); 2nd Recall Ltr mailed 10/18/2015 (dw)  . COLONOSCOPY 01/03/2016  Adenomatous Polyp: CBF 12/2020  . EGD 12/13/2002  . KNEE ARTHROSCOPY  Left knee arthroscopy  . LEFT TOTAL KNEE REPLACEMENT  . Right knee arthroscopy, partial medial meniscectomy, chondroplasty of the medial, lateral, and patellofemoral compartments. Right 02/25/14  . TONSILLECTOMY   Family History:  . Diabetes type II Mother  . High blood pressure (Hypertension) Mother  . Coronary Artery Disease (Blocked arteries around heart) Father  . High blood pressure (Hypertension) Father   Social History:  Socioeconomic History  . Marital status: Divorced  Spouse name: Silva Bandy  . Number of children: 0  . Years of education: 26  . Highest education level: Not on file  Occupational History  . Occupation: Full time PA  Tobacco Use  . Smoking status: Never Smoker  . Smokeless tobacco: Never Used  Vaping Use  . Vaping Use: Never used  Substance and Sexual Activity  . Alcohol use: No  Alcohol/week: 0.0 standard drinks  . Drug use: Not on file  . Sexual activity: Not on file  Other Topics Concern  . Not on file  Social History Narrative  . Not on file   Social Determinants of Health   Financial Resource Strain:  . Difficulty of Paying Living Expenses:  Food Insecurity:  . Worried About Charity fundraiser in the Last Year:  . [place] in the Last Year:  Transportation Needs:  . Lack of  Transportation (Medical):  Marland Kitchen Lack of Transportation (Non-Medical):   Review of Systems:  A comprehensive 14 point ROS was performed, reviewed, and the pertinent orthopaedic findings are documented in the HPI.  Physical Exam:  Vitals:  12/19/19 1310  Weight: (!) 112 kg (247 lb)  Height: 180.3 cm (5\' 11" )   General/Constitutional: Pleasant husky  middle-aged male in no acute distress. Neuro/Psych: Normal mood and affect, oriented to person, place and time. Eyes: Non-icteric. Pupils are equal, round, and reactive to light, and exhibit synchronous movement. ENT: Unremarkable. Lymphatic: No palpable adenopathy. Respiratory: Lungs clear to auscultation, Normal chest excursion, No wheezes and Non-labored breathing Cardiovascular: Regular rate and rhythm. No murmurs. and No edema, swelling or tenderness, except as noted in detailed exam. Integumentary: No impressive skin lesions present, except as noted in detailed exam. Musculoskeletal: Unremarkable, except as noted in detailed exam.  Right shoulder exam: SKIN: normal SWELLING: none WARMTH: none LYMPH NODES: no adenopathy palpable CREPITUS: none TENDERNESS: Moderate focal tenderness palpation over AC joint ROM (active):  Forward flexion: 130 degrees Abduction: 125 degrees Internal rotation: Right PSIS ROM (passive):  Forward flexion: 135 degrees Abduction: 130 degrees  ER/IR at 90 abd: 70 degrees / 40 degrees  He describes mild discomfort with internal rotation and internal rotation at 90 degrees of abduction.  STRENGTH: Forward flexion: 5/5 Abduction: 5/5 External rotation: 5/5 Internal rotation: 5/5 Pain with RC testing: No  STABILITY: Normal  SPECIAL TESTS: Luan Pulling' test: positive, mild Speed's test: negative Capsulitis - pain w/ passive ER: no Crossed arm test: Mildly positive Crank: Not evaluated Anterior apprehension: Negative Posterior apprehension: Not evaluated  He is neurovascularly intact to the right upper extremity.  Imaging:  Shoulder X-Ray Imaging: Recent true AP and Y-scapular views of the right shoulder are available for review. These films demonstrate significant degenerative changes with complete loss of the glenohumeral joint space. Lesser degenerative changes of the [facility] joint also are noted. The subacromial space is well-maintained. There is no  subacromial or infra-clavicular spurring. He demonstrates a Type II acromion.  Assessment:  . DJD of right AC (acromioclavicular) joint.  . Primary osteoarthritis of right shoulder.   Plan:  The treatment options were discussed with the patient. In addition, patient educational materials were provided regarding the diagnosis and treatment options. Despite the fairly advanced degenerative changes of his glenohumeral joint noted on plain radiographs, his exam suggest that his symptoms are actually coming from his [facility] joint rather than from the glenohumeral joint. The patient is quite frustrated by his symptoms and functional limitations, and is ready to consider more aggressive treatment options. Therefore, I have recommended a surgical procedure, specifically an open excision of the right distal clavicle. The procedure was discussed with the patient, as were the potential risks (including bleeding, infection, nerve and/or blood vessel injury, persistent or recurrent pain, need for further surgery, blood clots, strokes, heart attacks and/or arhythmias, pneumonia, etc.) and benefits. The patient states his understanding and wishes to proceed. All of the patient's questions and concerns were answered. He can call any time with further concerns. He will follow up post-surgery, routine.   H&P reviewed and patient re-examined. No changes.

## 2020-01-07 NOTE — Op Note (Signed)
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  Post-Op Diagnosis:   Same.  Procedure:   Open right distal clavicle excision.  Surgeon:   Pascal Lux, MD  Assistant:   None  Anesthesia:   GET  Findings:   As above.  Complications:   None  EBL:   25 cc  Fluids:   500 cc crystalloid  TT:   None  Drains:   None  Closure:   2-0 Vicryl subcuticular sutures.  Brief Clinical Note:   The patient is a 69 year old male with a history of progressively worsening superior right shoulder pain. His symptoms have progressed despite medications, activity modification, etc. His history examination consistent with advanced degenerative joint disease of the right AC joint, confirmed by plain radiographs. The patient presents at this time for definitive management of this injury.  Procedure:   The patient was brought into the operating room and lain in the supine position. After adequate general endotracheal intubation and anesthesia were obtained, the patient was repositioned in the beach chair position using the beach chair positioner. The right shoulder and upper extremity were prepped with DuraPrep solution before being draped sterilely. Preoperative antibiotics were administered. A timeout was performed to verify the appropriate surgical site.   An approximately 6 to 7 cm saber-type incision was made obliquely centered over the prominent distal clavicle. The incision was carried down through the subcutaneous tissues to expose the trapezio-pectoral fascia. This was split in line with the distal clavicle and the soft tissues elevated subperiosteally to expose the distal clavicle circumferentially. The distal 8 to 10 mm of distal clavicle was removed using a micro-oscillating saw before a small amount of bone wax was applied over the exposed end of the distal clavicle to help with hemostasis as well as to try to minimize the  risk of clavicular overgrowth. Hemostasis was achieved using electrocautery.  The wound was copiously irrigated with sterile saline solution before the capsular tissue over the [facility] joint was reapproximated using #0 Vicryl interrupted sutures. The subcutaneous tissues were closed using 2-0 Vicryl interrupted sutures before the skin was closed using 2-0 Vicryl inverted subcuticular sutures. Benzoin and Steri-Strips are applied to the skin. A total of 20 cc of Exparel was injected in and around the incision to help with postoperative analgesia before a sterile occlusive dressing was applied to the wound. The patient was placed into a shoulder sling before being awakened, extubated, and returned to the recovery room in satisfactory condition after tolerating the procedure well.

## 2020-01-08 LAB — SURGICAL PATHOLOGY

## 2021-04-08 ENCOUNTER — Other Ambulatory Visit: Payer: Self-pay | Admitting: Internal Medicine

## 2021-04-08 ENCOUNTER — Ambulatory Visit: Payer: Medicare Other

## 2021-04-08 DIAGNOSIS — M7989 Other specified soft tissue disorders: Secondary | ICD-10-CM

## 2021-04-11 ENCOUNTER — Other Ambulatory Visit: Payer: Self-pay

## 2021-04-11 ENCOUNTER — Ambulatory Visit
Admission: RE | Admit: 2021-04-11 | Discharge: 2021-04-11 | Disposition: A | Discharge status: Home / Self Care | Payer: Medicare Other | Source: Ambulatory Visit | Attending: Internal Medicine | Admitting: Internal Medicine

## 2021-04-11 DIAGNOSIS — M7989 Other specified soft tissue disorders: Secondary | ICD-10-CM | POA: Diagnosis present

## 2021-04-13 ENCOUNTER — Telehealth (INDEPENDENT_AMBULATORY_CARE_PROVIDER_SITE_OTHER): Payer: Self-pay

## 2021-04-13 NOTE — Telephone Encounter (Signed)
Patient called in stating that provider was speaking with Dr.Hotten about this patient and this patient called in wanting to speak with Dr. Carroll Kinds and get set up ASAP and would like Dr.Schiner to give him a call.  Patient states Dr.Schiner is familiar with him and know whats going on    Please call and advise

## 2021-04-14 ENCOUNTER — Telehealth (INDEPENDENT_AMBULATORY_CARE_PROVIDER_SITE_OTHER): Payer: Self-pay

## 2021-04-14 NOTE — Telephone Encounter (Signed)
Spoke with the patient and he is scheduled with Dr. Lucky Cowboy for a RLE thombectomy on 04/18/21 with a 2:00 pm arrival time to the MM. Pre-procedure instructions were discussed and patient did have a couple of questions that were answered.

## 2021-04-14 NOTE — Telephone Encounter (Signed)
It was Dr. Delana Meyer that I messeged.

## 2021-04-14 NOTE — Telephone Encounter (Signed)
I messaged the provider yesterday per a secure chat about this pt an what he needed .

## 2021-04-15 NOTE — Telephone Encounter (Signed)
Patient was made aware that his new arrival time for his procedure on 04/18/21 will be 9 am

## 2021-04-18 ENCOUNTER — Encounter: Admission: RE | Payer: Self-pay | Source: Home / Self Care

## 2021-04-18 ENCOUNTER — Ambulatory Visit: Admit: 2021-04-18 | Payer: Medicare Other | Admitting: Vascular Surgery

## 2021-04-18 ENCOUNTER — Other Ambulatory Visit (INDEPENDENT_AMBULATORY_CARE_PROVIDER_SITE_OTHER): Payer: Self-pay | Admitting: Nurse Practitioner

## 2021-04-18 ENCOUNTER — Ambulatory Visit: Admission: RE | Admit: 2021-04-18 | Payer: Medicare Other | Source: Home / Self Care | Admitting: Vascular Surgery

## 2021-04-18 DIAGNOSIS — I82409 Acute embolism and thrombosis of unspecified deep veins of unspecified lower extremity: Secondary | ICD-10-CM

## 2021-04-18 DIAGNOSIS — I82411 Acute embolism and thrombosis of right femoral vein: Secondary | ICD-10-CM

## 2021-04-18 SURGERY — PERIPHERAL VASCULAR THROMBECTOMY
Anesthesia: Moderate Sedation | Laterality: Right

## 2021-09-20 ENCOUNTER — Other Ambulatory Visit (INDEPENDENT_AMBULATORY_CARE_PROVIDER_SITE_OTHER): Payer: Self-pay | Admitting: Vascular Surgery

## 2021-09-20 DIAGNOSIS — I82401 Acute embolism and thrombosis of unspecified deep veins of right lower extremity: Secondary | ICD-10-CM

## 2021-09-22 ENCOUNTER — Ambulatory Visit (INDEPENDENT_AMBULATORY_CARE_PROVIDER_SITE_OTHER): Payer: Medicare Other

## 2021-09-22 ENCOUNTER — Ambulatory Visit (INDEPENDENT_AMBULATORY_CARE_PROVIDER_SITE_OTHER): Payer: Self-pay | Admitting: Vascular Surgery

## 2021-09-22 DIAGNOSIS — I82401 Acute embolism and thrombosis of unspecified deep veins of right lower extremity: Secondary | ICD-10-CM | POA: Diagnosis not present

## 2023-05-02 IMAGING — US US EXTREM LOW VENOUS*R*
1 series · 14 of 24 positions shown · non-contrast
Comparison: None.

CLINICAL DATA: Leg swelling

EXAM:
RIGHT LOWER EXTREMITY VENOUS DOPPLER ULTRASOUND
TECHNIQUE: Gray-scale sonography with compression, as well as color and duplex
ultrasound, were performed to evaluate the deep venous system(s)
from the level of the common femoral vein through the popliteal and
proximal calf veins.

[Series 1: us venous img lower uni right (dvt) · portal-venous · 14 of 34 slices shown]
[im 1/34]
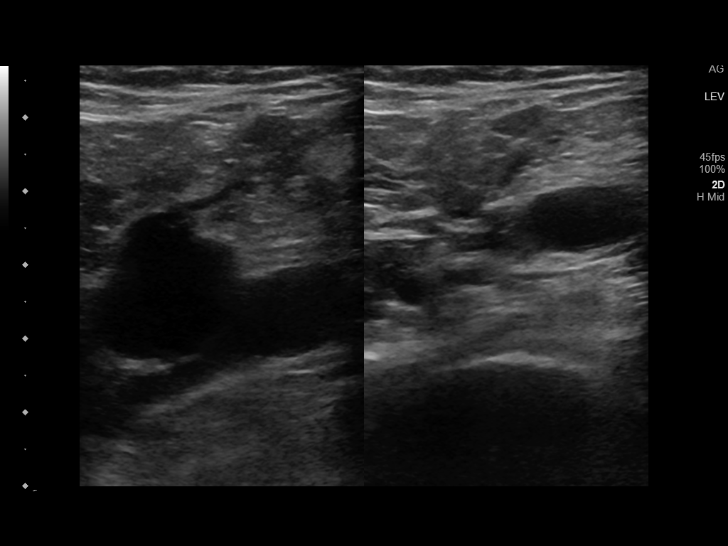
[im 3/34]
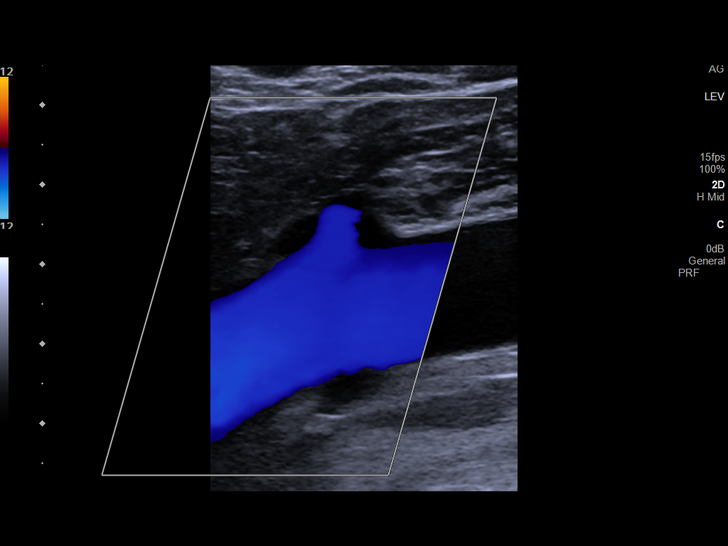
[im 6/34]
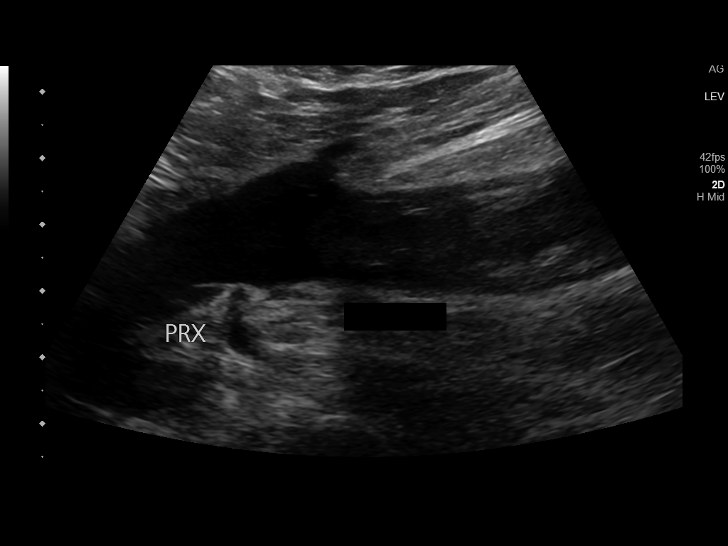
[im 9/34]
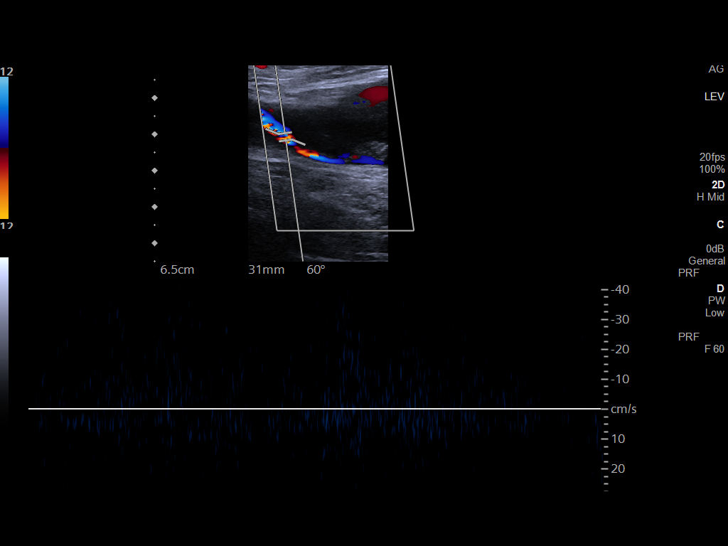
[im 11/34]
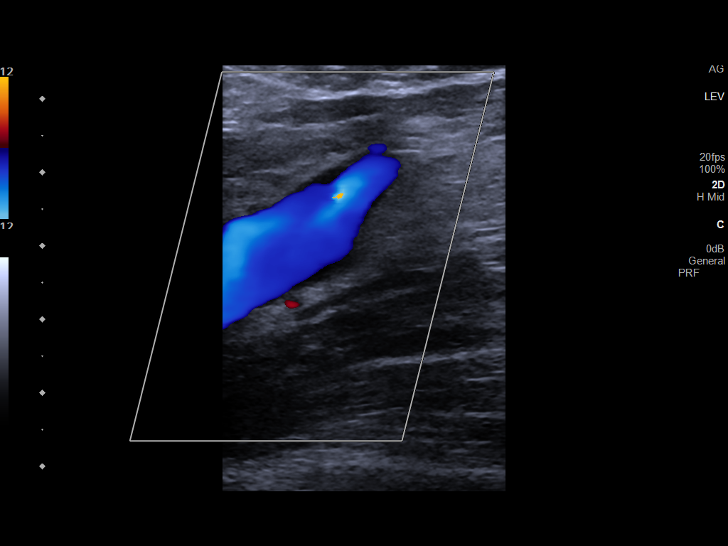
[im 13/34]
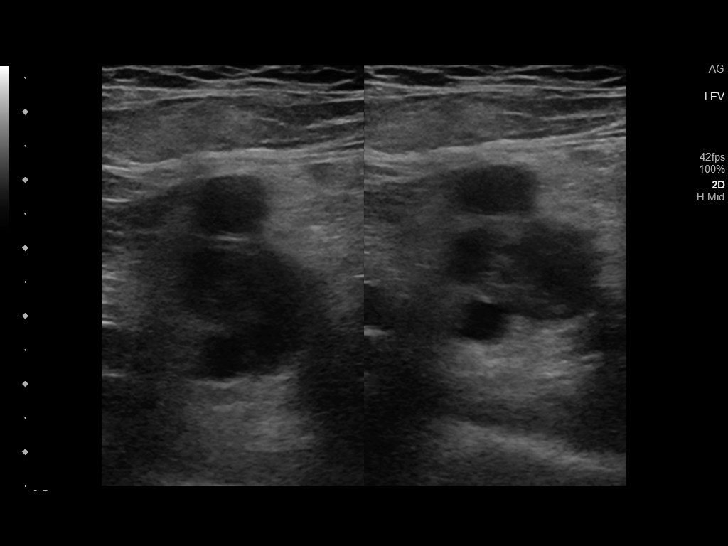
[im 16/34]
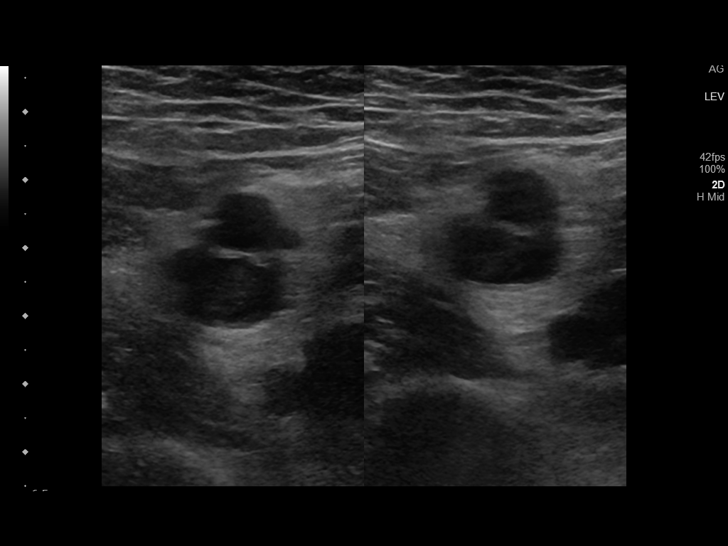
[im 18/34]
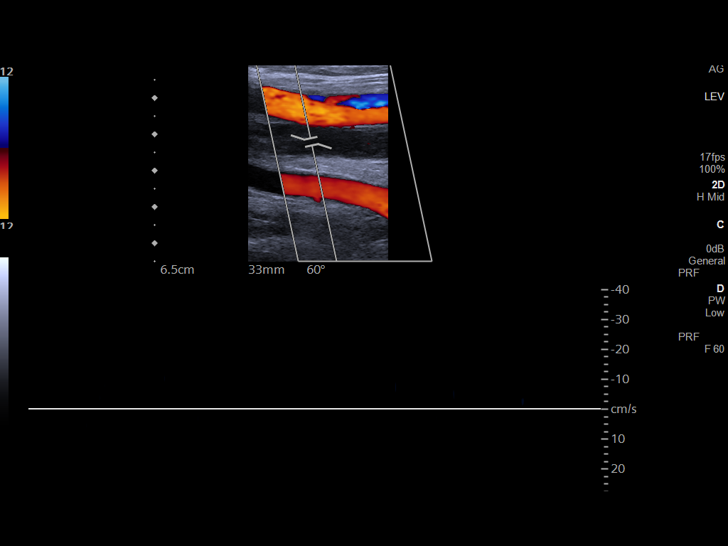
[im 21/34]
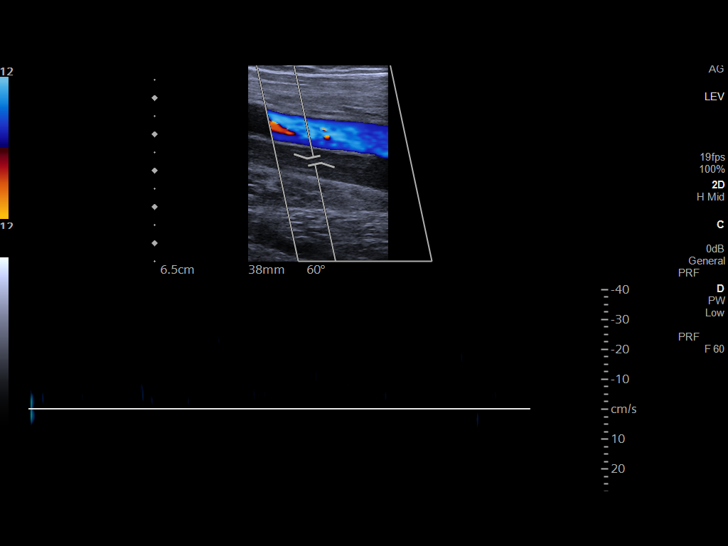
[im 23/34]
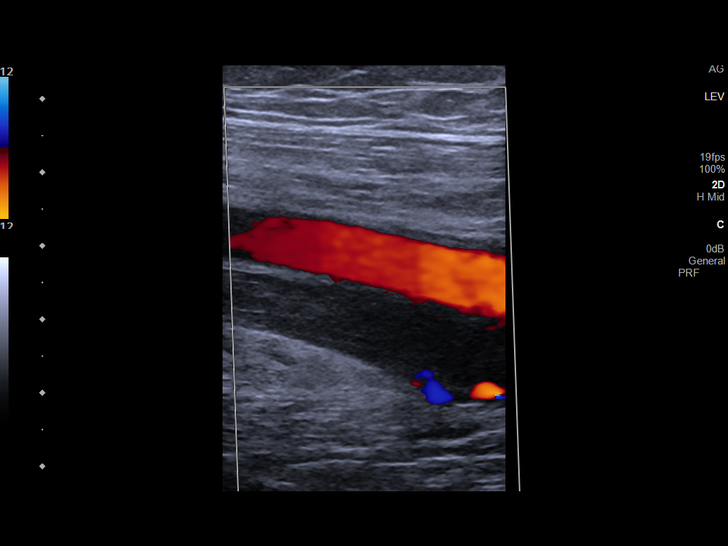
[im 26/34]
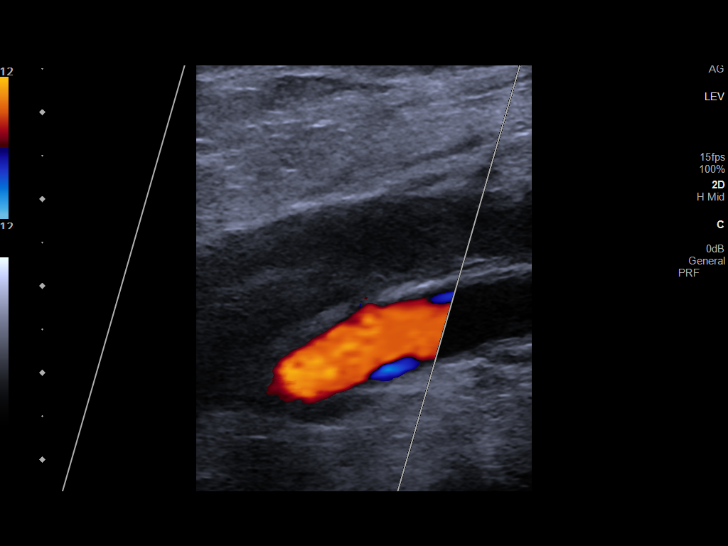
[im 28/34]
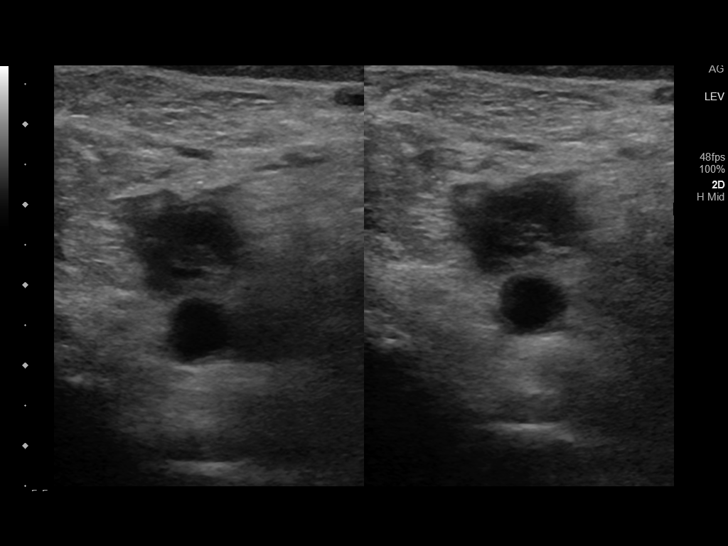
[im 31/34]
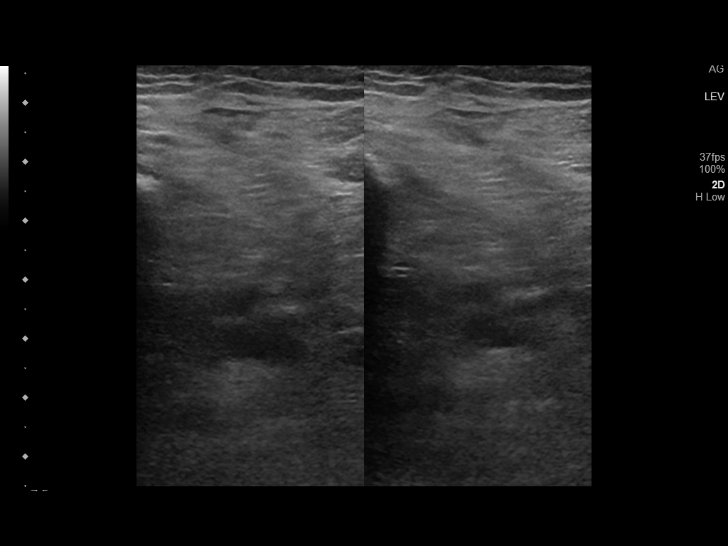
[im 34/34]
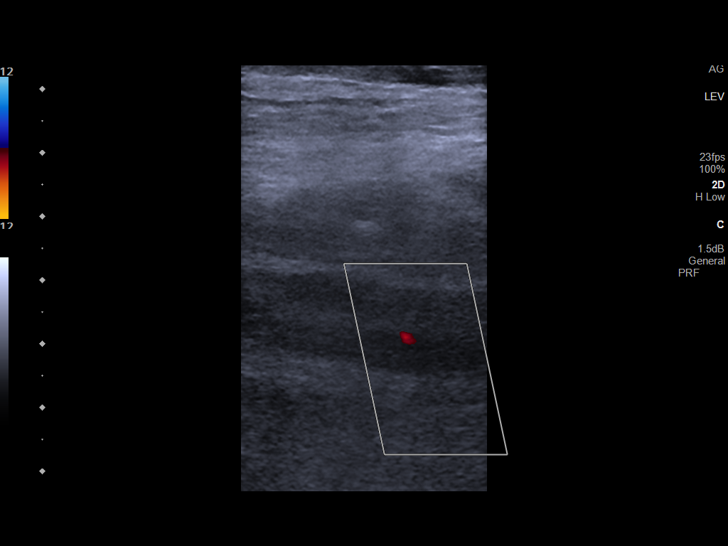

[14 of 24 positions shown; findings below may reference images not displayed]

FINDINGS: VENOUS

There is nonocclusive thrombus in the mid to distal common femoral
vein, the great saphenous vein and the profunda femora vein. There
is occlusive thrombus without flow seen within the femoral vein,
popliteal vein, posterior tibial vein, and peroneal veins.

OTHER

None.

Limitations: none
IMPRESSION: Positive for DVT in the right lower extremity. Occlusive thrombus in
the femoral vein, popliteal vein and calf veins. Nonocclusive
thrombus in the mid to distal common femoral vein, greater saphenous
vein, and profundus femora vein.

These results will be called to the ordering clinician or
representative by the Radiologist Assistant or technologist, and
communication documented in the PACS or [REDACTED].

## 2024-01-15 ENCOUNTER — Other Ambulatory Visit (INDEPENDENT_AMBULATORY_CARE_PROVIDER_SITE_OTHER): Payer: Self-pay | Admitting: Vascular Surgery

## 2024-01-15 DIAGNOSIS — I878 Other specified disorders of veins: Secondary | ICD-10-CM

## 2024-01-15 DIAGNOSIS — Z86718 Personal history of other venous thrombosis and embolism: Secondary | ICD-10-CM

## 2024-01-17 ENCOUNTER — Ambulatory Visit (INDEPENDENT_AMBULATORY_CARE_PROVIDER_SITE_OTHER): Payer: Medicare (Managed Care) | Admitting: Vascular Surgery

## 2024-01-17 ENCOUNTER — Encounter (INDEPENDENT_AMBULATORY_CARE_PROVIDER_SITE_OTHER): Payer: Self-pay | Admitting: Vascular Surgery

## 2024-01-17 ENCOUNTER — Ambulatory Visit (INDEPENDENT_AMBULATORY_CARE_PROVIDER_SITE_OTHER): Payer: Medicare (Managed Care)

## 2024-01-17 VITALS — BP 135/76 | HR 71 | Wt 266.2 lb

## 2024-01-17 DIAGNOSIS — I878 Other specified disorders of veins: Secondary | ICD-10-CM

## 2024-01-17 DIAGNOSIS — Z86718 Personal history of other venous thrombosis and embolism: Secondary | ICD-10-CM | POA: Diagnosis not present

## 2024-01-17 DIAGNOSIS — I10 Essential (primary) hypertension: Secondary | ICD-10-CM | POA: Diagnosis not present

## 2024-01-17 DIAGNOSIS — I8311 Varicose veins of right lower extremity with inflammation: Secondary | ICD-10-CM

## 2024-01-17 DIAGNOSIS — I8312 Varicose veins of left lower extremity with inflammation: Secondary | ICD-10-CM | POA: Diagnosis not present

## 2024-01-20 ENCOUNTER — Encounter (INDEPENDENT_AMBULATORY_CARE_PROVIDER_SITE_OTHER): Payer: Self-pay | Admitting: Vascular Surgery

## 2024-01-20 DIAGNOSIS — I8311 Varicose veins of right lower extremity with inflammation: Secondary | ICD-10-CM | POA: Insufficient documentation

## 2024-01-20 DIAGNOSIS — I10 Essential (primary) hypertension: Secondary | ICD-10-CM | POA: Insufficient documentation

## 2024-01-20 NOTE — Progress Notes (Signed)
 MRN : [id number]  Joe Dalton is a 73 y.o. ([birth year]) male who presents with chief complaint of varicose veins hurt.  History of Present Illness:   The patient is seen for evaluation of symptomatic varicose veins. The patient relates burning and stinging which worsened steadily throughout the course of the day, particularly with standing.   There is no history of DVT, PE or superficial thrombophlebitis. There is no history of ulceration or hemorrhage. The patient denies a significant family history of varicose veins.  The patient has worn graduated compression in the past. At the present time the patient has not been using over-the-counter analgesics. There is no history of prior surgical intervention or sclerotherapy.  Duplex ultrasound of the venous system shows trivial reflux in the common femoral vein but otherwise the deep venous system is normal.  There is reflux in the superficial system.  Current Meds  Medication Sig   telmisartan-hydrochlorothiazide (MICARDIS HCT) 80-25 MG tablet Take 1 tablet by mouth daily.    Past Medical History:  Diagnosis Date   Hypertension     Past Surgical History:  Procedure Laterality Date   COLONOSCOPY WITH PROPOFOL  N/A 01/03/2016   Procedure: COLONOSCOPY WITH PROPOFOL ;  Surgeon: Lamar ONEIDA Holmes, MD;  Location[facility] ENDOSCOPY;  Service: Endoscopy;  Laterality: N/A;   JOINT REPLACEMENT  2006   LTK    RESECTION DISTAL CLAVICAL Right 01/07/2020   Procedure: DISTAL CLAVICAL EXCISON OPEN;  Surgeon: Edie Norleen PARAS, MD;  Location: [facility] ORS;  Service: Orthopedics;  Laterality: Right;    Social History Social History   Tobacco Use   Smoking status: Never   Smokeless tobacco: Never  Vaping Use   Vaping status: Never Used  Substance Use Topics   Alcohol use: Never   Drug use: Never    Family History No family history on file.  No Known Allergies   REVIEW OF SYSTEMS (Negative unless checked)  Constitutional: [] Weight  loss  [] Fever  [] Chills Cardiac: [] Chest pain   [] Chest pressure   [] Palpitations   [] Shortness of breath when laying flat   [] Shortness of breath with exertion. Vascular:  [] Pain in legs with walking   [x] Pain in legs with standing  [] History of DVT   [] Phlebitis   [] Swelling in legs   [x] Varicose veins   [] Non-healing ulcers Pulmonary:   [] Uses home oxygen   [] Productive cough   [] Hemoptysis   [] Wheeze  [] COPD   [] Asthma Neurologic:  [] Dizziness   [] Seizures   [] History of stroke   [] History of TIA  [] Aphasia   [] Vissual changes   [] Weakness or numbness in arm   [] Weakness or numbness in leg Musculoskeletal:   [] Joint swelling   [] Joint pain   [] Low back pain Hematologic:  [] Easy bruising  [] Easy bleeding   [] Hypercoagulable state   [] Anemic Gastrointestinal:  [] Diarrhea   [] Vomiting  [] Gastroesophageal reflux/heartburn   [] Difficulty swallowing. Genitourinary:  [] Chronic kidney disease   [] Difficult urination  [] Frequent urination   [] Blood in urine Skin:  [] Rashes   [] Ulcers  Psychological:  [] History of anxiety   []  History of major depression.  Physical Examination  Vitals:   01/17/24 1321  BP: 135/76  Pulse: 71  Weight: 266 lb 4 oz (120.8 kg)   Body mass index is 37.13 kg/m. Gen: WD/WN, NAD Head: [place]/AT, No temporalis wasting.  Ear/Nose/Throat: Hearing grossly intact, nares w/o erythema or drainage, pinna without lesions Eyes: PER, EOMI, sclera  nonicteric.  Neck: Supple, no gross masses.  No JVD.  Pulmonary:  Good air movement, no audible wheezing, no use of accessory muscles.  Cardiac: RRR, precordium not hyperdynamic. Vascular:  Scattered varicosities present, smaller than 10 mm right.  Veins are tender to palpation  Mild venous stasis changes to the legs bilaterally.  Trace soft pitting edema CEAP C3sEpAsPr Vessel Right Left  Radial Palpable Palpable  Gastrointestinal: soft, non-distended. No guarding/no peritoneal signs.  Musculoskeletal: M/S 5/5 throughout.  No deformity.   Neurologic: CN 2-12 intact. Pain and light touch intact in extremities.  Symmetrical.  Speech is fluent. Motor exam as listed above. Psychiatric: Judgment intact, Mood & affect appropriate for pt's clinical situation. Dermatologic: Venous rashes no ulcers noted.  No changes consistent with cellulitis. Lymph : No lichenification or skin changes of chronic lymphedema.  CBC No results found for: WBC, HGB, HCT, MCV, PLT  BMET No results found for: NA, K, CL, CO2, GLUCOSE, BUN, CREATININE, CALCIUM, GFRNONAA, GFRAA CrCl cannot be calculated (No successful lab value found.).  COAG No results found for: INR, PROTIME  Radiology No results found.   Assessment/Plan 1. Varicose veins of both lower extremities with inflammation (Primary) Recommend:  The patient is complaining of varicose veins.    I have had a long discussion with the patient regarding  varicose veins and why they cause symptoms.  Patient will begin wearing graduated compression stockings on a daily basis, beginning first thing in the morning and removing them in the evening. The patient is instructed specifically not to sleep in the stockings.    The patient  will also begin using over-the-counter analgesics such as Motrin 600 mg po TID to help control the symptoms as needed.    In addition, behavioral modification including elevation during the day will be initiated, utilizing a recliner was recommended.  The patient is also instructed to continue exercising such as walking 4-5 times per week.  At this time the patient wishes to continue conservative therapy and is not interested in more invasive treatments such as laser ablation and sclerotherapy.  The Patient will follow up PRN if the symptoms worsen.  2. Essential hypertension Continue antihypertensive medications as already ordered, these medications have been reviewed and there are no changes at this time.    Cordella Shawl,  MD  01/20/2024 11:41 AM
